# Patient Record
Sex: Female | Born: 1990 | Race: Black or African American | Hispanic: No | Marital: Single | Smoking: Current some day smoker
Health system: Southern US, Community
[De-identification: ages and names within clinical notes are randomized; demographics above are authoritative.]

## PROBLEM LIST (undated history)

## (undated) DIAGNOSIS — R011 Cardiac murmur, unspecified: Secondary | ICD-10-CM

---

## 2017-12-17 ENCOUNTER — Emergency Department: Payer: Self-pay

## 2017-12-17 ENCOUNTER — Other Ambulatory Visit: Payer: Self-pay

## 2017-12-17 ENCOUNTER — Encounter: Payer: Self-pay | Admitting: Emergency Medicine

## 2017-12-17 ENCOUNTER — Emergency Department
Admission: EM | Admit: 2017-12-17 | Discharge: 2017-12-17 | Disposition: A | Discharge status: Home / Self Care | Payer: Self-pay | Attending: Emergency Medicine | Admitting: Emergency Medicine

## 2017-12-17 DIAGNOSIS — K920 Hematemesis: Secondary | ICD-10-CM

## 2017-12-17 DIAGNOSIS — R112 Nausea with vomiting, unspecified: Secondary | ICD-10-CM

## 2017-12-17 DIAGNOSIS — I10 Essential (primary) hypertension: Secondary | ICD-10-CM

## 2017-12-17 DIAGNOSIS — R079 Chest pain, unspecified: Secondary | ICD-10-CM

## 2017-12-17 HISTORY — DX: Cardiac murmur, unspecified: R01.1

## 2017-12-17 LAB — BASIC METABOLIC PANEL
Anion gap: 8 (ref 5–15)
BUN: 14 mg/dL (ref 6–20)
CO2: 21 mmol/L — ABNORMAL LOW (ref 22–32)
CREATININE: 0.99 mg/dL (ref 0.44–1.00)
Calcium: 8.9 mg/dL (ref 8.9–10.3)
Chloride: 109 mmol/L (ref 98–111)
GFR calc Af Amer: >60 mL/min (ref >60)
Glucose, Bld: 101 mg/dL — ABNORMAL HIGH (ref 70–99)
Potassium: 3.3 mmol/L — ABNORMAL LOW (ref 3.5–5.1)
Sodium: 138 mmol/L (ref 135–145)

## 2017-12-17 LAB — CBC
HCT: 35.3 % (ref 35.0–47.0)
Hemoglobin: 11.9 g/dL — ABNORMAL LOW (ref 12.0–16.0)
MCH: 27.7 pg (ref 26.0–34.0)
MCHC: 33.8 g/dL (ref 32.0–36.0)
MCV: 82.1 fL (ref 80.0–100.0)
PLATELETS: 229 10*3/uL (ref 150–440)
RBC: 4.3 MIL/uL (ref 3.80–5.20)
RDW: 15.6 % — AB (ref 11.5–14.5)
WBC: 5.5 10*3/uL (ref 3.6–11.0)

## 2017-12-17 LAB — TROPONIN I: Troponin I: <0.03 ng/mL (ref <0.03)

## 2017-12-17 MED ORDER — TRAMADOL HCL 50 MG PO TABS
50.0000 mg | ORAL_TABLET | Freq: Once | ORAL | Status: AC
  Administered 2017-12-17: 50 mg via ORAL
  Filled 2017-12-17: qty 1

## 2017-12-17 MED ORDER — ONDANSETRON HCL 4 MG PO TABS
4.0000 mg | ORAL_TABLET | Freq: Once | ORAL | Status: AC
  Administered 2017-12-17: 4 mg via ORAL
  Filled 2017-12-17: qty 1

## 2017-12-17 MED ORDER — TRAMADOL HCL 50 MG PO TABS: 50.0000 mg | ORAL_TABLET | Freq: Four times a day (QID) | ORAL | 0 refills | Status: AC | PRN

## 2017-12-17 MED ORDER — HYDROCHLOROTHIAZIDE 12.5 MG PO CAPS: 12.5000 mg | ORAL_CAPSULE | Freq: Every day | ORAL | 0 refills | Status: DC

## 2017-12-17 MED ORDER — FAMOTIDINE 40 MG PO TABS: 40.0000 mg | ORAL_TABLET | Freq: Every evening | ORAL | 0 refills | Status: DC

## 2017-12-17 NOTE — ED Notes (Signed)
ED Provider at bedside. 

## 2017-12-17 NOTE — ED Triage Notes (Addendum)
Pt arrives via POV with complaints of right-sided chest pain last Wednesday or Thursday. Reports coughing for "a couple weeks" but this morning coughed up bright red blood x1. Also c/o vomiting. States she has vomited "over 5 times" in the last 24 hours.   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

## 2017-12-17 NOTE — ED Provider Notes (Signed)
[facility] Emergency Department Provider Note ____________________________________________   First MD Initiated Contact with Patient 12/17/17 [phone]     (approximate)  I have reviewed the triage vital signs and the nursing notes.   HISTORY  Chief Complaint Chest Pain  HPI Felicia Watkins is a 27 y.o. female with a history of chronic chest pain as well as chronic vomiting who is presenting to the emergency department with vomiting blood this morning as well as right-sided chest pain.  She says that she has vomited over 5 times in last 24 hours which she states is her baseline.  She also states that she has had intermittent chest pain over the past week which is now become constant and is a 9 out of 10, cramping and sharp pain.  It does not radiate.  She denies any shortness of breath.  No history of blood clots.  Denies being on any birth control.  Denies coughing up any blood.  She said that she became concerned this morning when she vomited and there were streaks of blood in the vomit she says which she has not had before.  She says that she has been evaluated [place] times in [place], [facility].  She says that she is also been told that she has high blood pressure multiple times in the past but has never been put on any medications for this.  Says that she smokes marijuana but does not drink or use any other drugs.  Denies smoking cigarettes.  Says that she had an aunt that died a young age from heart failure but denies any first-degree relatives dying of heart attacks.  Says that there are no alleviating or worsening factors for the chest pain including exertion or deep breathing.  Says the tramadol is helped to relieve the pain in the past.  Past Medical History:  Diagnosis Date  . Murmur     There are no active problems to display for this patient.   History reviewed. No pertinent surgical history.  Prior to Admission medications    Not on File    Allergies Patient has no known allergies.  No family history on file.  Social History Social History   Tobacco Use  . Smoking status: Not on file  Substance Use Topics  . Alcohol use: Not on file  . Drug use: Not on file    Review of Systems  Constitutional: No fever/chills Eyes: No visual changes. ENT: No sore throat. Cardiovascular: As above Respiratory: Denies shortness of breath. Gastrointestinal: No abdominal pain.    No diarrhea.  No constipation. Genitourinary: Negative for dysuria. Musculoskeletal: Negative for back pain. Skin: Negative for rash. Neurological: Negative for headaches, focal weakness or numbness.   ____________________________________________   PHYSICAL EXAM:  VITAL SIGNS: ED Triage Vitals  Enc Vitals Group     BP 12/17/17 0844 (!) 183/116     Pulse Rate 12/17/17 0842 87     Resp 12/17/17 0842 18     Temp 12/17/17 0842 98.7 F (37.1 C)     Temp src --      SpO2 12/17/17 0842 100 %     Weight 12/17/17 0844 200 lb (90.7 kg)     Height 12/17/17 0844 5\' 2"  (1.575 m)     Head Circumference --      Peak Flow --      Pain Score 12/17/17 0852 9     Pain Loc --      Pain  Edu? --      Excl. in GC? --     Constitutional: Alert and oriented. Well appearing and in no acute distress. Eyes: Conjunctivae are normal.  Head: Atraumatic. Nose: No congestion/rhinnorhea. Mouth/Throat: Mucous membranes are moist.  Neck: No stridor.   Cardiovascular: Normal rate, regular rhythm. Grossly normal heart sounds.  Good peripheral circulation. Respiratory: Normal respiratory effort.  No retractions. Lungs CTAB. Gastrointestinal: Soft and nontender. No distention.  Musculoskeletal: No lower extremity tenderness nor edema.  No joint effusions. Neurologic:  Normal speech and language. No gross focal neurologic deficits are appreciated. Skin:  Skin is warm, dry and intact. No rash noted. Psychiatric: Mood and affect are normal. Speech and  behavior are normal.  ____________________________________________   LABS (all labs ordered are listed, but only abnormal results are displayed)  Labs Reviewed  BASIC METABOLIC PANEL - Abnormal; Notable for the following components:      Result Value   Potassium 3.3 (*)    CO2 21 (*)    Glucose, Bld 101 (*)    All other components within normal limits  CBC - Abnormal; Notable for the following components:   Hemoglobin 11.9 (*)    RDW 15.6 (*)    All other components within normal limits  TROPONIN I  POC URINE PREG, ED   ____________________________________________  EKG  ED ECG REPORT I, Arelia Longest, the attending physician, personally viewed and interpreted this ECG.   Date: 12/17/2017  EKG Time: 0851  Rate: 74  Rhythm: normal sinus rhythm  Axis: Normal  Intervals:none  ST&T Change: No ST segment elevation or depression.  No abnormal T wave inversion.  ____________________________________________  RADIOLOGY  No acute finding on the chest x-ray ____________________________________________   PROCEDURES  Procedure(s) performed:   Procedures  Critical Care performed:   ____________________________________________   INITIAL IMPRESSION / ASSESSMENT AND PLAN / ED COURSE  Pertinent labs & imaging results that were available during my care of the patient were reviewed by me and considered in my medical decision making (see chart for details).  Differential diagnosis includes, but is not limited to, ACS, aortic dissection, pulmonary embolism, cardiac tamponade, pneumothorax, pneumonia, pericarditis, myocarditis, GI-related causes including esophagitis/gastritis, and musculoskeletal chest wall pain.   As part of my medical decision making, I reviewed the following data within the electronic MEDICAL RECORD NUMBERNo previous records able to be found even though the patient states that she has previously been evaluated at [facility]  ----------------------------------------- 11:29 AM on 12/17/2017 -----------------------------------------  Patient's chest pain is relieved after tramadol.  Very reassuring lab work-up.  Low risk for ACS.  PE RC negative.  Patient has not vomited in the emergency department and is tolerating p.o. fluids at this time.  I will give her cardiology as well as primary care follow-up.  Will be discharged with tramadol, HCTZ as well as Pepcid.  Likely blood in the vomitus secondary to Mallory-Weiss from repetitive vomiting.  Patient appears to be improved at this time. ____________________________________________   FINAL CLINICAL IMPRESSION(S) / ED DIAGNOSES  Chest pain.  Hypertension.  Nausea and vomiting.  NEW MEDICATIONS STARTED DURING THIS VISIT:  New Prescriptions   No medications on file     Note:  This document was prepared using Dragon voice recognition software and may include unintentional dictation errors.     Myrna Blazer, MD 12/17/17 1130

## 2017-12-17 NOTE — ED Notes (Signed)
Pt presents with chest pain x 3-4 days, with n/v/sob intermittently. She vomited "large amount" of blood this morning. Hx of heart murmur.

## 2017-12-18 ENCOUNTER — Telehealth: Payer: Self-pay

## 2017-12-18 NOTE — Telephone Encounter (Signed)
Lmov for patient/she was seen om ER for CP on 12/17/17  Will try again at a later time

## 2017-12-20 NOTE — Telephone Encounter (Signed)
Scheduled 9/24 Arida

## 2018-02-11 ENCOUNTER — Ambulatory Visit: Payer: Self-pay | Admitting: Cardiovascular Disease

## 2018-02-11 ENCOUNTER — Encounter

## 2018-02-11 NOTE — Progress Notes (Deleted)
    Cardiology Office Note   Date:  02/11/2018   ID:  Felicia Watkins, DOB Nov 08, 1990, MRN [id number]  PCP:  Patient, No Pcp Per  Cardiologist:  Lorine Bears*** Taiwo Fish, MD   No chief complaint on file.     History of Present Illness: Felicia Watkins is a 27 y.o. female who presents for ***    Past Medical History:  Diagnosis Date  . Murmur     No past surgical history on file.   Current Outpatient Medications  Medication Sig Dispense Refill  . famotidine (PEPCID) 40 MG tablet Take 1 tablet (40 mg total) by mouth every evening. 30 tablet 0  . hydrochlorothiazide (MICROZIDE) 12.5 MG capsule Take 1 capsule (12.5 mg total) by mouth daily. 15 capsule 0  . traMADol (ULTRAM) 50 MG tablet Take 1 tablet (50 mg total) by mouth every 6 (six) hours as needed. 10 tablet 0   No current facility-administered medications for this visit.     Allergies:   Patient has no known allergies.    Social History:  The patient     Family History:  The patient's ***family history is not on file.    ROS:  Please see the history of present illness.   Otherwise, review of systems are positive for {NONE DEFAULTED:18576::"none"}.   All other systems are reviewed and negative.    PHYSICAL EXAM: VS:  There were no vitals taken for this visit. , BMI There is no height or weight on file to calculate BMI. GEN: Well nourished, well developed, in no acute distress  HEENT: normal  Neck: no JVD, carotid bruits, or masses Cardiac: ***RRR; no murmurs, rubs, or gallops,no edema  Respiratory:  clear to auscultation bilaterally, normal work of breathing GI: soft, nontender, nondistended, + BS MS: no deformity or atrophy  Skin: warm and dry, no rash Neuro:  Strength and sensation are intact Psych: euthymic mood, full affect   EKG:  EKG {ACTION; IS/IS [id number]}OT:[id number]} ordered today. The ekg ordered today demonstrates ***   Recent Labs: 12/17/2017: BUN 14; Creatinine, Ser 0.99; Hemoglobin 11.9; Platelets 229;  Potassium 3.3; Sodium 138    Lipid Panel No results found for: CHOL, TRIG, HDL, CHOLHDL, VLDL, LDLCALC, LDLDIRECT    Wt Readings from Last 3 Encounters:  12/17/17 200 lb (90.7 kg)      Other studies Reviewed: Additional studies/ records that were reviewed today include: ***. Review of the above records demonstrates: ***  No flowsheet data found.    ASSESSMENT AND PLAN:  1.  ***    Disposition:   FU with *** in {gen number [id number]}0-10:[id number]} {Days to years:10300}  Signed,  Lorine BearsMuhammad Ridhaan Dreibelbis, MD  02/11/2018 1:56 PM    [facility]

## 2018-02-12 ENCOUNTER — Encounter: Payer: Self-pay | Admitting: Cardiovascular Disease

## 2018-12-09 ENCOUNTER — Encounter: Payer: Self-pay | Admitting: Emergency Medicine

## 2018-12-09 ENCOUNTER — Emergency Department
Admission: EM | Admit: 2018-12-09 | Discharge: 2018-12-09 | Disposition: A | Discharge status: Home / Self Care | Payer: Self-pay | Attending: Emergency Medicine | Admitting: Emergency Medicine

## 2018-12-09 ENCOUNTER — Other Ambulatory Visit: Payer: Self-pay

## 2018-12-09 DIAGNOSIS — Z720 Tobacco use: Secondary | ICD-10-CM | POA: Insufficient documentation

## 2018-12-09 DIAGNOSIS — R1110 Vomiting, unspecified: Secondary | ICD-10-CM | POA: Insufficient documentation

## 2018-12-09 NOTE — ED Provider Notes (Signed)
[facility] Emergency Department Provider Note  ____________________________________________  Time seen: Approximately 5:24 PM  I have reviewed the triage vital signs and the nursing notes.   HISTORY  Chief Complaint Medical Clearance    HPI Felicia Watkins is a 28 y.o. female that presents to the emergency department for a return to work note.  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  It was about 100 F today outside and hotter in the kitchen.  Patient states that she got hot and threw up once.  Her manager told her to go home and come back to work with a new work note.  Patient states that she has felt fine since her one episode of emesis.  She is drinking plenty of water.  She is hungry and is planning to get takeout food when she leaves the hospital.  No URI symptoms.  No sick contacts.  Patient feels well.  No fever, shortness of breath, cough, nasal congestion, diarrhea.  Past Medical History:  Diagnosis Date  . Murmur     There are no active problems to display for this patient.   History reviewed. No pertinent surgical history.  Prior to Admission medications   Medication Sig Start Date End Date Taking? Authorizing Provider  famotidine (PEPCID) 40 MG tablet Take 1 tablet (40 mg total) by mouth every evening. 12/17/17 12/17/18  Schaevitz, Randall An, MD  hydrochlorothiazide (MICROZIDE) 12.5 MG capsule Take 1 capsule (12.5 mg total) by mouth daily. 12/17/17   Schaevitz, Randall An, MD  traMADol (ULTRAM) 50 MG tablet Take 1 tablet (50 mg total) by mouth every 6 (six) hours as needed. 12/17/17 12/17/18  Schaevitz, Randall An, MD    Allergies Patient has no known allergies.  History reviewed. No pertinent family history.  Social History Social History   Tobacco Use  . Smoking status: Current Some Day Smoker  . Smokeless tobacco: Never Used  Substance Use Topics  . Alcohol use: Not  on file  . Drug use: Not on file     Review of Systems  Constitutional: No fever/chills Cardiovascular: No chest pain. Respiratory: No cough. No SOB. Gastrointestinal: No nausea. One episode of vomiting. No abdominal pain Musculoskeletal: Negative for musculoskeletal pain. Skin: Negative for rash, abrasions, lacerations, ecchymosis. Neurological: Negative for headaches.   ____________________________________________   PHYSICAL EXAM:  VITAL SIGNS: ED Triage Vitals  Enc Vitals Group     BP 12/09/18 1700 (!) 157/95     Pulse Rate 12/09/18 1700 74     Resp 12/09/18 1700 16     Temp 12/09/18 1700 98.1 F (36.7 C)     Temp Source 12/09/18 1700 Oral     SpO2 12/09/18 1700 100 %     Weight 12/09/18 1703 165 lb (74.8 kg)     Height 12/09/18 1703 5\' 2"  (1.575 m)     Head Circumference --      Peak Flow --      Pain Score 12/09/18 1702 0     Pain Loc --      Pain Edu? --      Excl. in [place]? --      Constitutional: Alert and oriented. Well appearing and in no acute distress. Eyes: Conjunctivae are normal. PERRL. EOMI. Head: Atraumatic. ENT:      Ears:      Nose: No congestion/rhinnorhea.      Mouth/Throat: Mucous membranes are moist.  Neck: No stridor.  Cardiovascular: Normal rate, regular rhythm.  Good peripheral circulation. Respiratory: Normal respiratory effort without tachypnea or retractions. Lungs CTAB. Good air entry to the bases with no decreased or absent breath sounds. Gastrointestinal: Bowel sounds 4 quadrants. Soft and nontender to palpation. No guarding or rigidity. No palpable masses. No distention.  Musculoskeletal: Full range of motion to all extremities. No gross deformities appreciated. Neurologic:  Normal speech and language. No gross focal neurologic deficits are appreciated.  Skin:  Skin is warm, dry and intact. No rash noted. Psychiatric: Mood and affect are normal. Speech and behavior are normal. Patient exhibits appropriate insight and  judgement.   ____________________________________________   LABS (all labs ordered are listed, but only abnormal results are displayed)  Labs Reviewed - No data to display ____________________________________________  EKG   ____________________________________________  RADIOLOGY   No results found.  ____________________________________________    PROCEDURES  Procedure(s) performed:    Procedures    Medications - No data to display   ____________________________________________   INITIAL IMPRESSION / ASSESSMENT AND PLAN / ED COURSE  Pertinent labs & imaging results that were available during my care of the patient were reviewed by me and considered in my medical decision making (see chart for details).  Review of the [place] CSRS was performed in accordance of the [facility] prior to dispensing any controlled drugs.   Patient presents emergency department requesting a return to work note.  Patient vomited once in the heat while at work in the kitchen.  Patient appears well and is eating graham crackers and peanut butter in the emergency department without difficulty.  No URI symptoms or contacts with COVID-19.  Work note was provided.  Patient is to follow up with primary care as directed. Patient is given ED precautions to return to the ED for any worsening or new symptoms.  Felicia Watkins was evaluated in Emergency Department on 12/09/2018 for the symptoms described in the history of present illness. She was evaluated in the context of the global COVID-19 pandemic, which necessitated consideration that the patient might be at risk for infection with the SARS-CoV-2 virus that causes COVID-19. Institutional protocols and algorithms that pertain to the evaluation of patients at risk for COVID-19 are in a state of rapid change based on information released by regulatory bodies including the CDC and federal and state organizations. These policies and algorithms were followed during  the patient's care in the ED.   ____________________________________________  FINAL CLINICAL IMPRESSION(S) / ED DIAGNOSES  Final diagnoses:  Vomiting, intractability of vomiting not specified, presence of nausea not specified, unspecified vomiting type      NEW MEDICATIONS STARTED DURING THIS VISIT:  ED Discharge Orders    None          This chart was dictated using voice recognition software/Dragon. Despite best efforts to proofread, errors can occur which can change the meaning. Any change was purely unintentional.    Enid DerryWagner, Ariadna Setter, PA-C 12/09/18 2305    Dionne BucySiadecki, Sebastian, MD 12/09/18 [phone]

## 2018-12-09 NOTE — Discharge Instructions (Signed)
If symptoms return, please do not return to work.  Please follow-up with primary care.

## 2018-12-09 NOTE — ED Triage Notes (Signed)
Pt presents via POV. Pt needs doctors note for clearance to return back to work. Pt works at [employer] and states there was no air conditioning and she got overheated and threw up. Pt manager at work requesting pt get work note before she returns to work.

## 2019-09-20 ENCOUNTER — Emergency Department
Admission: EM | Admit: 2019-09-20 | Discharge: 2019-09-20 | Disposition: A | Discharge status: Home / Self Care | Payer: Self-pay | Attending: Student | Admitting: Student

## 2019-09-20 ENCOUNTER — Other Ambulatory Visit: Payer: Self-pay

## 2019-09-20 ENCOUNTER — Encounter: Payer: Self-pay | Admitting: Emergency Medicine

## 2019-09-20 DIAGNOSIS — Z79899 Other long term (current) drug therapy: Secondary | ICD-10-CM | POA: Insufficient documentation

## 2019-09-20 DIAGNOSIS — R112 Nausea with vomiting, unspecified: Secondary | ICD-10-CM | POA: Insufficient documentation

## 2019-09-20 DIAGNOSIS — R030 Elevated blood-pressure reading, without diagnosis of hypertension: Secondary | ICD-10-CM | POA: Insufficient documentation

## 2019-09-20 DIAGNOSIS — F17200 Nicotine dependence, unspecified, uncomplicated: Secondary | ICD-10-CM | POA: Insufficient documentation

## 2019-09-20 MED ORDER — HYDROCHLOROTHIAZIDE 12.5 MG PO CAPS: 12.5000 mg | ORAL_CAPSULE | Freq: Every day | ORAL | 3 refills | Status: DC

## 2019-09-20 NOTE — ED Triage Notes (Signed)
Pt started working at [employer] and they were trying to get air working and was hot inside and pt vomit X 1. They made her leave. Does not want work up just wants a work note.

## 2019-09-20 NOTE — Discharge Instructions (Signed)
Your exam and vital signs are normal at this time. You BP is elevated, and we will restart a BP medicine for you. Continue to rest and hydrate to prevent nausea, vomiting, or passing out. Follow-up with [facility] for routine healthcare.

## 2019-09-20 NOTE — ED Provider Notes (Signed)
[facility] Emergency Department Provider Note ____________________________________________  Time seen: 1357  I have reviewed the triage vital signs and the nursing notes.  HISTORY  Chief Complaint  work note  HPI Felicia Watkins is a 29 y.o. female presents herself to the ED at the advice of her employer.   Patient works at a newly opened [employer], and reports that the central air in [employer] was not working.  She reports getting overheated and had a single episode of nonbloody, nonbilious emesis.  The advised her to leave work, and report to the ED for evaluation prior to being cleared for work.  Patient denies any ongoing symptoms at this time.  She reports she feels healthy and well.  She has not had any continued nausea or vomiting since a single episode.  She also denies any cough, congestion, fever, chills, sweats.  She also denies any daily medications and reports a history of hypertension.  She admits that she has not taken blood pressure medicine since her initial prescription was provided here a year prior.  Past Medical History:  Diagnosis Date  . Murmur     There are no problems to display for this patient.   History reviewed. No pertinent surgical history.  Prior to Admission medications   Medication Sig Start Date End Date Taking? Authorizing Provider  hydrochlorothiazide (MICROZIDE) 12.5 MG capsule Take 1 capsule (12.5 mg total) by mouth daily. 09/20/19 01/18/20  Reznor Ferrando, Charlesetta Ivory, PA-C    Allergies Patient has no known allergies.  History reviewed. No pertinent family history.  Social History Social History   Tobacco Use  . Smoking status: Current Some Day Smoker  . Smokeless tobacco: Never Used  Substance Use Topics  . Alcohol use: Not on file  . Drug use: Not on file    Review of Systems  Constitutional: Negative for fever. Eyes: Negative for visual changes. ENT: Negative for sore throat. Cardiovascular:  Negative for chest pain. Respiratory: Negative for shortness of breath. Gastrointestinal: Negative for abdominal pain and diarrhea.  Single episode of vomiting as above. Genitourinary: Negative for dysuria. Musculoskeletal: Negative for back pain. Skin: Negative for rash. Neurological: Negative for headaches, focal weakness or numbness. ____________________________________________  PHYSICAL EXAM:  VITAL SIGNS: ED Triage Vitals  Enc Vitals Group     BP 09/20/19 1333 (!) 152/93     Pulse Rate 09/20/19 1333 99     Resp 09/20/19 1333 16     Temp 09/20/19 1333 98.1 F (36.7 C)     Temp Source 09/20/19 1333 Oral     SpO2 09/20/19 1333 100 %     Weight 09/20/19 1330 170 lb (77.1 kg)     Height 09/20/19 1330 5\' 2"  (1.575 m)     Head Circumference --      Peak Flow --      Pain Score 09/20/19 1330 0     Pain Loc --      Pain Edu? --      Excl. in GC? --     Constitutional: Alert and oriented. Well appearing and in no distress. Head: Normocephalic and atraumatic. Eyes: Conjunctivae are normal. Normal extraocular movements Neck: Supple. No thyromegaly. Hematological/Lymphatic/Immunological: No cervical lymphadenopathy. Cardiovascular: Normal rate, regular rhythm. Normal distal pulses. Respiratory: Normal respiratory effort. No wheezes/rales/rhonchi. Gastrointestinal: Soft and nontender. No distention. Musculoskeletal: Nontender with normal range of motion in all extremities.  Neurologic:  Normal gait without ataxia. Normal speech and language. No gross focal neurologic deficits are appreciated.  Skin:  Skin is warm, dry and intact. No rash noted. Psychiatric: Mood and affect are normal. Patient exhibits appropriate insight and judgment. ____________________________________________  PROCEDURES  Procedures ____________________________________________  INITIAL IMPRESSION / ASSESSMENT AND PLAN / ED COURSE  Patient with ED evaluation of a single episode of nausea and vomiting.   Patient has declined any further testing at this time.  I advised her that we would not be able to clearly determine if there is a cause for her single episode of nausea and vomiting, but she clearly seems to be stable at this time.  She will be discharged charged with a prescription for HCTZ for control of her blood pressure.  She is advised to continue to monitor symptoms and hydrate to prevent dehydration.  She is released [facility].  A work note is provided return her to work tomorrow as planned.  Felicia Watkins was evaluated in Emergency Department on 09/20/2019 for the symptoms described in the history of present illness. She was evaluated in the context of the global COVID-19 pandemic, which necessitated consideration that the patient might be at risk for infection with the SARS-CoV-2 virus that causes COVID-19. Institutional protocols and algorithms that pertain to the evaluation of patients at risk for COVID-19 are in a state of rapid change based on information released by regulatory bodies including the CDC and federal and state organizations. These policies and algorithms were followed during the patient's care in the ED. ____________________________________________  FINAL CLINICAL IMPRESSION(S) / ED DIAGNOSES  Final diagnoses:  Nausea and vomiting, intractability of vomiting not specified, unspecified vomiting type  Elevated BP without diagnosis of hypertension      Hildagarde Holleran, Dannielle Karvonen, PA-C 09/20/19 2055    Lilia Pro., MD 09/21/19 [phone]

## 2019-09-20 NOTE — ED Notes (Signed)
See triage note  Stats she needs a note to go back to work    States she became "hot" then dizzy  Vomited times 1   Feels much better now

## 2019-12-08 ENCOUNTER — Telehealth: Payer: Self-pay | Admitting: General Practice

## 2019-12-08 NOTE — Telephone Encounter (Signed)
Individual has been contacted 3+ times regarding ED referral. No further attempts to contact individual will be made. 

## 2020-02-11 IMAGING — CR DG CHEST 2V
1 series · 2 of 2 positions shown · non-contrast
Comparison: None.

CLINICAL DATA: Chest pain

EXAM:
CHEST - 2 VIEW

[Series 1: dg chest 2 view · 0.14mm/px · 2 of 2 slices shown]
[im 1/2]
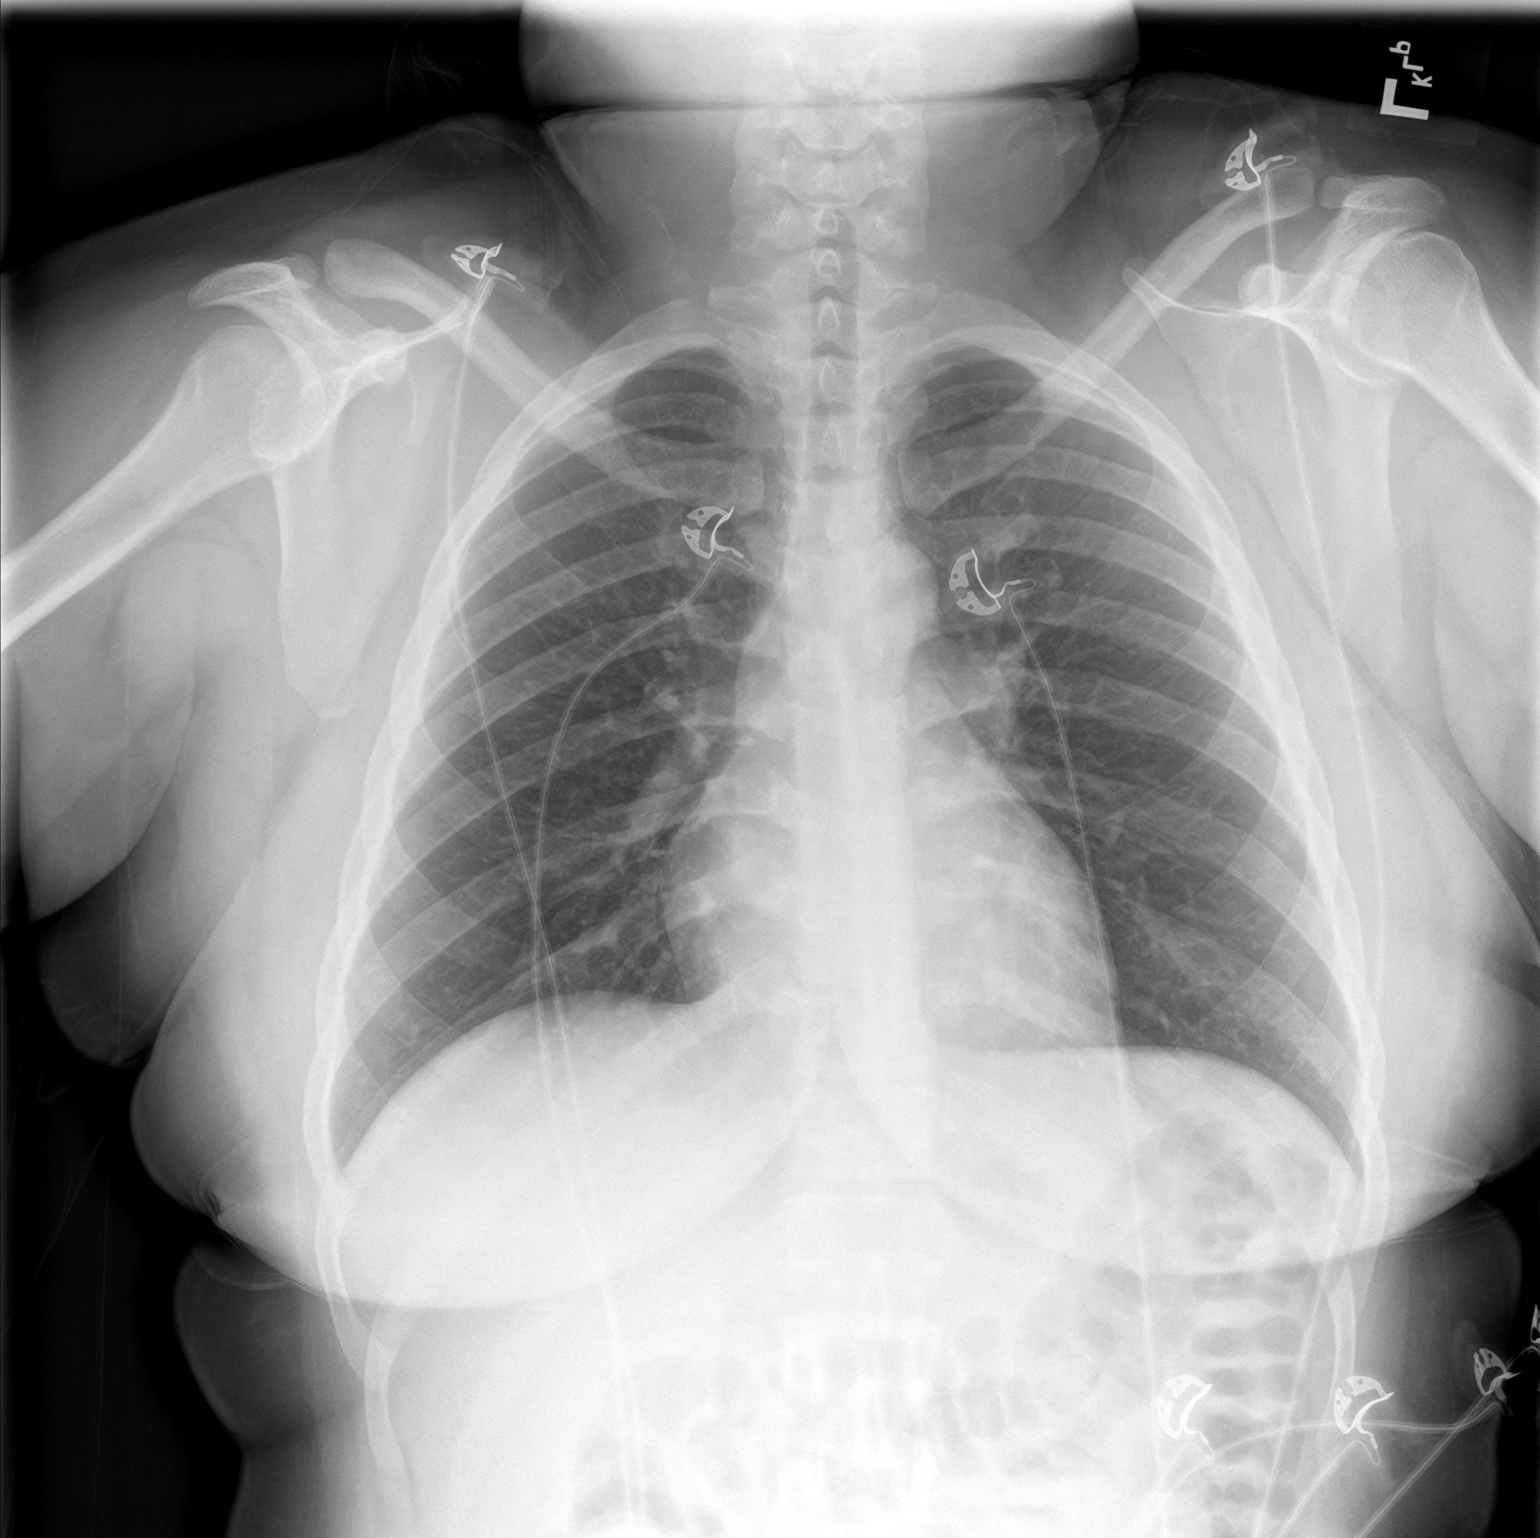
[im 2/2]
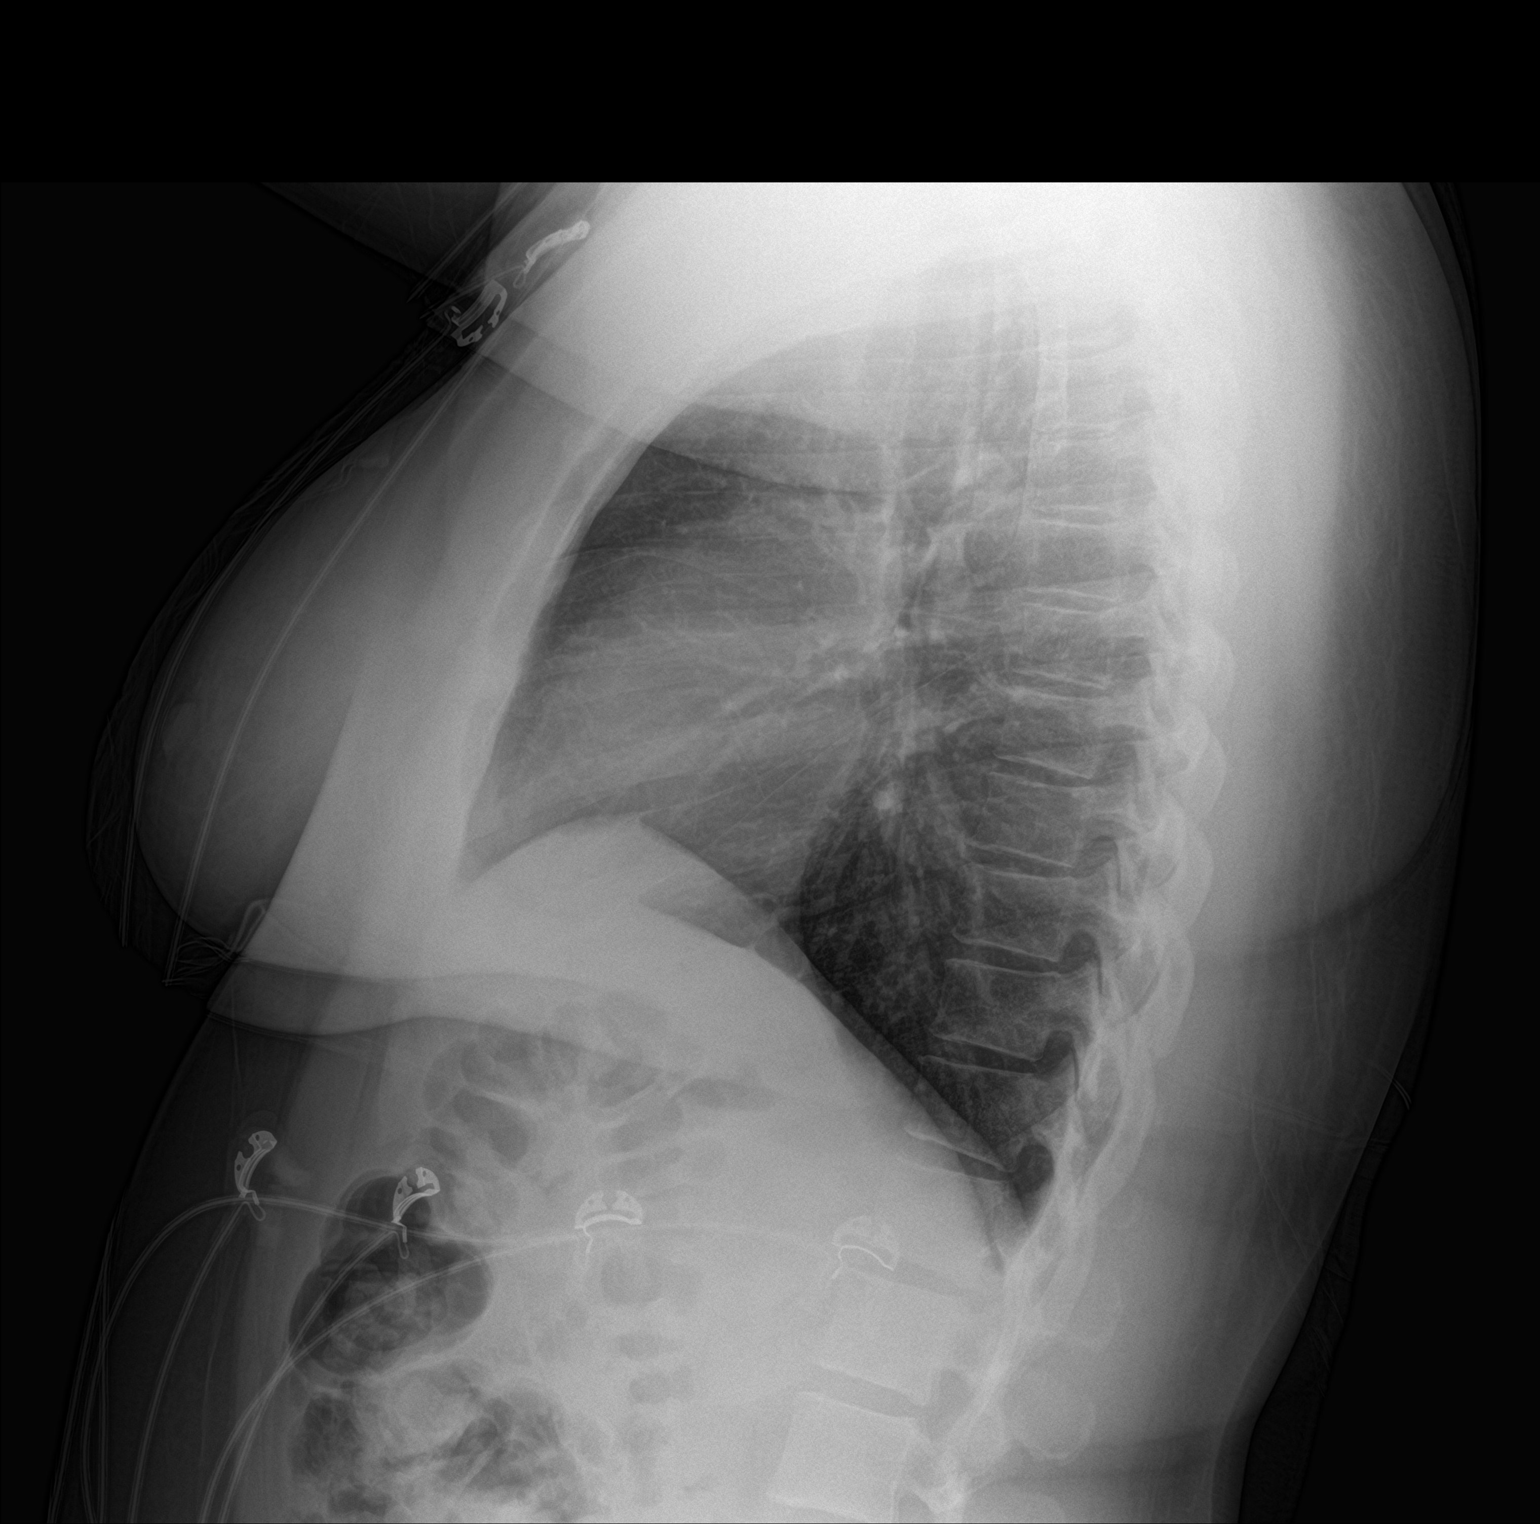

[2 of 2 positions shown; findings below may reference images not displayed]

FINDINGS: Heart and mediastinal contours are within normal limits. No focal
opacities or effusions. No acute bony abnormality.
IMPRESSION: No active cardiopulmonary disease.

## 2020-11-25 ENCOUNTER — Other Ambulatory Visit: Payer: Self-pay

## 2020-11-25 ENCOUNTER — Emergency Department
Admission: EM | Admit: 2020-11-25 | Discharge: 2020-11-25 | Disposition: A | Discharge status: Home / Self Care | Payer: Self-pay | Attending: Emergency Medicine | Admitting: Emergency Medicine

## 2020-11-25 DIAGNOSIS — F17200 Nicotine dependence, unspecified, uncomplicated: Secondary | ICD-10-CM | POA: Insufficient documentation

## 2020-11-25 DIAGNOSIS — I10 Essential (primary) hypertension: Secondary | ICD-10-CM | POA: Insufficient documentation

## 2020-11-25 DIAGNOSIS — R42 Dizziness and giddiness: Secondary | ICD-10-CM

## 2020-11-25 MED ORDER — HYDROCHLOROTHIAZIDE 12.5 MG PO CAPS: 12.5000 mg | ORAL_CAPSULE | Freq: Every day | ORAL | 1 refills | Status: DC

## 2020-11-25 NOTE — ED Triage Notes (Signed)
First Nurse Note:  Arrives c/o having a dizzy spell at work.  States "I think I was just hot.  I work in a [employer].  I really just need a doctors note".  AAOx3.  Skin warm and dry. NAD

## 2020-11-25 NOTE — ED Provider Notes (Signed)
[facility] Emergency Department Provider Note  ____________________________________________  Time seen: Approximately 7:08 PM  I have reviewed the triage vital signs and the nursing notes.   HISTORY  Chief Complaint evaluation   HPI Felicia Watkins is a 30 y.o. female who presents to the emergency department after getting dizzy at work. She states that the air conditioning has been out and she just got too hot, felt dizzy, and vomited. She feels fine now and just needs a work excuse.  Past Medical History:  Diagnosis Date   Murmur     There are no problems to display for this patient.   No past surgical history on file.  Prior to Admission medications   Medication Sig Start Date End Date Taking? Authorizing Provider  hydrochlorothiazide (MICROZIDE) 12.5 MG capsule Take 1 capsule (12.5 mg total) by mouth daily. 11/25/20 03/25/21  Chinita Pester, FNP    Allergies Patient has no known allergies.  No family history on file.  Social History Social History   Tobacco Use   Smoking status: Some Days    Pack years: 0.00   Smokeless tobacco: Never    Review of Systems Constitutional: Negative for fever. ENT: Negative for sore throat. Respiratory: negative for cough Gastrointestinal: No abdominal pain.  No nausea, no vomiting.  No diarrhea.  Musculoskeletal: Negative for generalized body aches. Skin: Negative for rash/lesion/wound. Neurological: Negative for headaches, focal weakness or numbness.  ____________________________________________   PHYSICAL EXAM:  VITAL SIGNS: ED Triage Vitals  Enc Vitals Group     BP 11/25/20 1815 (!) 167/115     Pulse Rate 11/25/20 1815 88     Resp 11/25/20 1815 20     Temp 11/25/20 1815 98.5 F (36.9 C)     Temp Source 11/25/20 1815 Oral     SpO2 11/25/20 1815 100 %     Weight 11/25/20 1816 164 lb (74.4 kg)     Height 11/25/20 1816 5\' 2"  (1.575 m)     Head Circumference --      Peak Flow --      Pain  Score 11/25/20 1816 0     Pain Loc --      Pain Edu? --      Excl. in GC? --     Constitutional: Alert and oriented. Well appearing and in no acute distress. Eyes: Conjunctivae are normal. Head: Atraumatic. Nose: No congestion/rhinnorhea. Mouth/Throat: Mucous membranes are moist. Neck: No stridor.  Cardiovascular: Normal rate, regular rhythm. Good peripheral circulation. Respiratory: Normal respiratory effort. Musculoskeletal: Full ROM throughout.  Neurologic:  Normal speech and language. No gross focal neurologic deficits are appreciated. Speech is normal. No gait instability. Skin:  Skin is warm, dry and intact. No rash noted. Psychiatric: Mood and affect are normal. Speech and behavior are normal.  ____________________________________________   LABS (all labs ordered are listed, but only abnormal results are displayed)  Labs Reviewed - No data to display ____________________________________________  EKG  Not indicated. ____________________________________________  RADIOLOGY  Not indicated. ____________________________________________   PROCEDURES  None ____________________________________________   INITIAL IMPRESSION / ASSESSMENT AND PLAN / ED COURSE     Pertinent labs & imaging results that were available during my care of the patient were reviewed by me and considered in my medical decision making (see chart for details).  Patient noted to be hypertensive. She states that she has been on medicine in the past, but has been out for a "long time." She denies dizziness in the past and feels that  it is not related to blood pressure.   Patient was advised to take HCTZ as prescribed and advised the importance of establishing PCP and getting blood pressure under control. ____________________________________________   FINAL CLINICAL IMPRESSION(S) / ED DIAGNOSES  Final diagnoses:  Hypertension, unspecified type  Dizziness       Chinita Pester,  FNP 11/25/20 1913    Chesley Noon, MD 11/29/20 1737

## 2020-11-25 NOTE — ED Triage Notes (Signed)
Pt to ED for doctor's note. Says work told her she needed one d/t getting dizzy spell at work today, states a/c is out a has been working a lot of days. "Just got a dizzy episode". Denies any complaints at all.  Denies pain, dizziness, loc. Hx HTN Alert and oriented. Ambulatory to triage.  Pt states just wants doctors note  Denies EKG or blood work

## 2021-01-24 ENCOUNTER — Emergency Department
Admission: EM | Admit: 2021-01-24 | Discharge: 2021-01-24 | Disposition: A | Discharge status: Home / Self Care | Payer: Self-pay | Attending: Emergency Medicine | Admitting: Emergency Medicine

## 2021-01-24 DIAGNOSIS — R1110 Vomiting, unspecified: Secondary | ICD-10-CM | POA: Insufficient documentation

## 2021-01-24 DIAGNOSIS — Z5321 Procedure and treatment not carried out due to patient leaving prior to being seen by health care provider: Secondary | ICD-10-CM | POA: Insufficient documentation

## 2021-01-24 NOTE — ED Notes (Signed)
Called pt x3 to be roomed. No answer.

## 2021-01-24 NOTE — ED Triage Notes (Signed)
Pt presets to ED for work note per her work. Pt states she got really hot and had one instance of emesis. Pt states she works at [employer] and states "its really hot back there". Pt states she felt fine before this happened and feels fine now. Pt denies any complaints at this time.  NAD noted. VSS.

## 2022-03-20 ENCOUNTER — Emergency Department
Admission: EM | Admit: 2022-03-20 | Discharge: 2022-03-20 | Disposition: A | Discharge status: Home / Self Care | Payer: 59 | Attending: Emergency Medicine | Admitting: Emergency Medicine

## 2022-03-20 ENCOUNTER — Other Ambulatory Visit: Payer: Self-pay

## 2022-03-20 DIAGNOSIS — D649 Anemia, unspecified: Secondary | ICD-10-CM | POA: Insufficient documentation

## 2022-03-20 DIAGNOSIS — R42 Dizziness and giddiness: Secondary | ICD-10-CM

## 2022-03-20 DIAGNOSIS — R1110 Vomiting, unspecified: Secondary | ICD-10-CM | POA: Insufficient documentation

## 2022-03-20 DIAGNOSIS — R112 Nausea with vomiting, unspecified: Secondary | ICD-10-CM

## 2022-03-20 LAB — URINALYSIS, ROUTINE W REFLEX MICROSCOPIC
Bacteria, UA: NONE SEEN
Bilirubin Urine: NEGATIVE
Glucose, UA: NEGATIVE mg/dL
Ketones, ur: NEGATIVE mg/dL
Leukocytes,Ua: NEGATIVE
Nitrite: NEGATIVE
Protein, ur: NEGATIVE mg/dL
Specific Gravity, Urine: 1.017 (ref 1.005–1.030)
pH: 6 (ref 5.0–8.0)

## 2022-03-20 LAB — COMPREHENSIVE METABOLIC PANEL
ALT: 12 U/L (ref 0–44)
AST: 17 U/L (ref 15–41)
Albumin: 3.6 g/dL (ref 3.5–5.0)
Alkaline Phosphatase: 57 U/L (ref 38–126)
Anion gap: 5 (ref 5–15)
BUN: 11 mg/dL (ref 6–20)
CO2: 21 mmol/L — ABNORMAL LOW (ref 22–32)
Calcium: 8.8 mg/dL — ABNORMAL LOW (ref 8.9–10.3)
Chloride: 110 mmol/L (ref 98–111)
Creatinine, Ser: 0.85 mg/dL (ref 0.44–1.00)
GFR, Estimated: >60 mL/min (ref >60)
Glucose, Bld: 98 mg/dL (ref 70–99)
Potassium: 3.9 mmol/L (ref 3.5–5.1)
Sodium: 136 mmol/L (ref 135–145)
Total Bilirubin: 0.9 mg/dL (ref 0.3–1.2)
Total Protein: 7.5 g/dL (ref 6.5–8.1)

## 2022-03-20 LAB — CBC
HCT: 28.9 % — ABNORMAL LOW (ref 36.0–46.0)
Hemoglobin: 8.2 g/dL — ABNORMAL LOW (ref 12.0–15.0)
MCH: 19.2 pg — ABNORMAL LOW (ref 26.0–34.0)
MCHC: 28.4 g/dL — ABNORMAL LOW (ref 30.0–36.0)
MCV: 67.8 fL — ABNORMAL LOW (ref 80.0–100.0)
Platelets: 373 10*3/uL (ref 150–400)
RBC: 4.26 MIL/uL (ref 3.87–5.11)
RDW: 17.4 % — ABNORMAL HIGH (ref 11.5–15.5)
WBC: 5.7 10*3/uL (ref 4.0–10.5)
nRBC: 0 % (ref 0.0–0.2)

## 2022-03-20 LAB — LIPASE, BLOOD: Lipase: 39 U/L (ref 11–51)

## 2022-03-20 LAB — POC URINE PREG, ED: Preg Test, Ur: NEGATIVE

## 2022-03-20 MED ORDER — FERROUS SULFATE 325 (65 FE) MG PO TABS: 325.0000 mg | ORAL_TABLET | Freq: Every day | ORAL | 3 refills | Status: AC

## 2022-03-20 NOTE — Discharge Instructions (Addendum)
As we discussed please call the number provided for [facility] clinic to arrange a follow-up appointment soon as possible to discuss your anemia.  Please begin taking your iron supplements as prescribed.  Return to the emergency department for any chest pain, shortness of breath, or any other symptom personally concerning to yourself.

## 2022-03-20 NOTE — ED Triage Notes (Signed)
Pt to ED via POV from work. Pt reports she works two jobs and got dizzy this morning and vomited x1. Pt states symptoms have resolved at this time.   Pt BP in triage 178/102 but states she has been told she has high BP but is not on any medication currently.

## 2022-03-20 NOTE — ED Provider Notes (Signed)
[facility] Provider Note    Event Date/Time   First MD Initiated Contact with Patient 03/20/22 [phone]     (approximate)  History   Chief Complaint: Dizziness (Emesis x1)  HPI  Felicia Watkins is a 31 y.o. female with no significant past medical history presents to the emergency department after an episode of dizziness and vomiting.  According to the patient she states she works 2 jobs, she had to show up to her second job at 4 AM after working the evening at her other job.  States when she got to her job she was just feeling somewhat dizzy had 1 episode of vomiting.  She states after she vomited she feels much better and now denies any symptoms.  States she feels like she just needs some sleep.  Patient denies any recent illnesses such as cough congestion or fever.  Denies any chest pain shortness of breath or abdominal pain.  Physical Exam   Triage Vital Signs: ED Triage Vitals [03/20/22 0812]  Enc Vitals Group     BP (!) 178/102     Pulse Rate 92     Resp 18     Temp 98.1 F (36.7 C)     Temp Source Oral     SpO2 99 %     Weight      Height      Head Circumference      Peak Flow      Pain Score 0     Pain Loc      Pain Edu?      Excl. in GC?     Most recent vital signs: Vitals:   03/20/22 0812  BP: (!) 178/102  Pulse: 92  Resp: 18  Temp: 98.1 F (36.7 C)  SpO2: 99%    General: Awake, no distress.  CV:  Good peripheral perfusion.  Regular rate and rhythm  Resp:  Normal effort.  Equal breath sounds bilaterally.  Abd:  No distention.  Soft, nontender.  No rebound or guarding.   ED Results / Procedures / Treatments   MEDICATIONS ORDERED IN ED: Medications - No data to display   IMPRESSION / MDM / ASSESSMENT AND PLAN / ED COURSE  I reviewed the triage vital signs and the nursing notes.  Patient's presentation is most consistent with acute illness / injury with system symptoms.  Patient presents emergency department after an  episode of dizziness and vomiting at her job.  Patient states she did not get much sleep as she works 2 jobs she should have her second job at 4 AM and states she was not feeling very well she vomited x1 and is now feeling much better per patient.  Patient denies any symptoms currently.  Patient believes this occurred because she did not get much sleep before going to work.  Patient denies any abdominal pain.  We will check labs.  If the patient's labs are reassuring anticipate likely discharge home.  Patient agreeable to plan of care.  Patient's labs show reassuring chemistry normal lipase.  Patient CBC does show anemia hemoglobin 8.2.  Discussed this with the patient she denies any black or bloody stool.  States she will follow-up with her doctor regarding this.  Patient's urinalysis has not been sent yet but the patient states she is ready to go and she wants to go home and sleep.  Denies any urinary symptoms.  We will discharge with PCP follow-up.  Patient agreeable to plan.  We will prescribe  iron supplements until the patient can follow-up with her PCP given MCV of 67.  FINAL CLINICAL IMPRESSION(S) / ED DIAGNOSES   Emesis Anemia   Note:  This document was prepared using Dragon voice recognition software and may include unintentional dictation errors.   Harvest Dark, MD 03/20/22 [phone]

## 2022-04-06 ENCOUNTER — Other Ambulatory Visit: Payer: Self-pay

## 2022-04-06 ENCOUNTER — Encounter: Payer: Self-pay | Admitting: Emergency Medicine

## 2022-04-06 ENCOUNTER — Emergency Department
Admission: EM | Admit: 2022-04-06 | Discharge: 2022-04-06 | Disposition: A | Discharge status: Home / Self Care | Payer: 59 | Attending: Emergency Medicine | Admitting: Emergency Medicine

## 2022-04-06 DIAGNOSIS — R42 Dizziness and giddiness: Secondary | ICD-10-CM | POA: Insufficient documentation

## 2022-04-06 LAB — CBG MONITORING, ED: Glucose-Capillary: 115 mg/dL — ABNORMAL HIGH (ref 70–99)

## 2022-04-06 NOTE — ED Triage Notes (Signed)
Pt comes with c/o getting a little dizzy while at work. Pt state she was at work and it got overheated in the kitchen and smoky. Pt state she got a little dizzy feeling. Pt denies any blurry vision or pain.  Pt states she is just tired. Pt thinks she may have just gotten overwhelmed.

## 2022-04-06 NOTE — Discharge Instructions (Signed)
Stay Well-hydrated by drinking fluids.  Thank you for choosing [place] for your health care today!  Please see your primary doctor this week for a follow up appointment.   If you do not have a primary doctor call the following clinics to establish care:  If you have insurance:  [facility] [phone] [address] [place] [place] [address]   [facility]  [phone] [address] [place] [place] [address]   If you do not have insurance:  [facility]  [phone] [address] [place] [address]  Sometimes, in the early stages of certain disease courses it is difficult to detect in the emergency department evaluation -- so, it is important that you continue to monitor your symptoms and call your doctor right away or return to the emergency department if you develop any new or worsening symptoms.  It was my pleasure to care for you today.   Daneil Dan Modesto Charon, MD

## 2022-04-06 NOTE — ED Provider Notes (Signed)
[facility] Provider Note    Event Date/Time   First MD Initiated Contact with Patient 04/06/22 1109     (approximate)   History   Dizziness   HPI  Felicia Watkins is a 31 y.o. 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  The fans are broken over the cooking area and kitchen causing the kitchen to be very hot.  She had no chest pain shortness of breath palpitations and did not pass out.  After removing herself from the heat she felt a lot better.  She has no other complaints and feels well now.  History was obtained via the patient      Physical Exam   Triage Vital Signs: ED Triage Vitals  Enc Vitals Group     BP 04/06/22 1055 (!) 169/108     Pulse Rate 04/06/22 1055 89     Resp 04/06/22 1055 18     Temp 04/06/22 1055 98 F (36.7 C)     Temp src --      SpO2 04/06/22 1055 100 %     Weight 04/06/22 1108 164 lb 0.4 oz (74.4 kg)     Height 04/06/22 1108 5\' 2"  (1.575 m)     Head Circumference --      Peak Flow --      Pain Score 04/06/22 1055 0     Pain Loc --      Pain Edu? --      Excl. in GC? --     Most recent vital signs: Vitals:   04/06/22 1055  BP: (!) 169/108  Pulse: 89  Resp: 18  Temp: 98 F (36.7 C)  SpO2: 100%    General: Awake, no distress.  CV:  Good peripheral perfusion.  Resp:  Normal effort.  Abd:  No distention.  Other:  Awake alert pleasant and cooperative, oriented, moving all extremities appears euvolemic.   ED Results / Procedures / Treatments   Labs (all labs ordered are listed, but only abnormal results are displayed) Labs Reviewed  CBG MONITORING, ED - Abnormal; Notable for the following components:      Result Value   Glucose-Capillary 115 (*)    All other components within normal limits     I reviewed labs and they are notable for glucose in 100s  EKG  ED  ECG REPORT I, 04/08/22, the attending physician, personally viewed and interpreted this ECG.   Date: 04/06/2022  EKG Time: 1141  Rate: 83  Rhythm: normal sinus rhythm  Axis: nl  Intervals:none  ST&T Change: no ischemic changes  or dysrhtmia      PROCEDURES:  Critical Care performed: No  Procedures   MEDICATIONS ORDERED IN ED: Medications - No data to display   IMPRESSION / MDM / ASSESSMENT AND PLAN / ED COURSE  I reviewed the triage vital signs and the nursing notes.                              Differential diagnosis includes, but is not limited to, heat exhaustion, dysrhythmia, hypoglycemia   MDM: Patient with exposure to heat and felt overheated, transient symptoms and not prolonged exposure.  Doubt heat exhaustion heatstroke or other significant heat related pathologies.  Check EKG which is normal sinus rhythm, rule out dysrhythmias or ACS which was  unlikely to begin with.  Also check blood sugar which was normal.  Patient feels well and would like a work note.  I gave her a work note and she is safe for discharge with PMD follow-up.   Patient's presentation is most consistent with acute presentation with potential threat to life or bodily function.       FINAL CLINICAL IMPRESSION(S) / ED DIAGNOSES   Final diagnoses:  Lightheadedness     Rx / DC Orders   ED Discharge Orders     None        Note:  This document was prepared using Dragon voice recognition software and may include unintentional dictation errors.    Pilar Jarvis, MD 04/06/22 [phone]

## 2022-08-28 ENCOUNTER — Emergency Department
Admission: EM | Admit: 2022-08-28 | Discharge: 2022-08-28 | Disposition: A | Discharge status: Home / Self Care | Payer: 59 | Attending: Emergency Medicine | Admitting: Emergency Medicine

## 2022-08-28 DIAGNOSIS — T733XXA Exhaustion due to excessive exertion, initial encounter: Secondary | ICD-10-CM | POA: Diagnosis not present

## 2022-08-28 DIAGNOSIS — R5383 Other fatigue: Secondary | ICD-10-CM | POA: Diagnosis not present

## 2022-08-28 DIAGNOSIS — I10 Essential (primary) hypertension: Secondary | ICD-10-CM | POA: Insufficient documentation

## 2022-08-28 MED ORDER — HYDROCHLOROTHIAZIDE 12.5 MG PO CAPS: 12.5000 mg | ORAL_CAPSULE | Freq: Every day | ORAL | 2 refills | Status: AC

## 2022-08-28 NOTE — ED Triage Notes (Signed)
Pt sts that she works in a [employer] and also works another full time job and believes that she got overheated and is just tired, however he boss wants her to get a Dr note. Pt is ambulatory in triage, A/Ox4.

## 2022-08-28 NOTE — ED Provider Notes (Signed)
[facility] Emergency Department Provider Note     Event Date/Time   First MD Initiated Contact with Patient 08/28/22 1130     (approximate)   History   Fatigue   HPI  Felicia Watkins is a 32 y.o. female history of HTN, presents to the ED at the advice of her supervisor.  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  She reports symptoms today after she got overheated.  She now is reporting some fatigue but denies any fever, chills, sweats, chest pain, or shortness of breath.  Patient also denies any headache or syncope.  Her employer wishes her to be evaluated and returned with a doctor's note.  Physical Exam   Triage Vital Signs: ED Triage Vitals [08/28/22 1127]  Enc Vitals Group     BP (!) 160/99     Pulse Rate 88     Resp 17     Temp 98.1 F (36.7 C)     Temp Source Oral     SpO2 99 %     Weight 174 lb (78.9 kg)     Height      Head Circumference      Peak Flow      Pain Score 0     Pain Loc      Pain Edu?      Excl. in GC?     Most recent vital signs: Vitals:   08/28/22 1127  BP: (!) 160/99  Pulse: 88  Resp: 17  Temp: 98.1 F (36.7 C)  SpO2: 99%    General Awake, no distress. NAD CV:  Good peripheral perfusion.  RESP:  Normal effort.  ABD:  No distention.   ED Results / Procedures / Treatments   Labs (all labs ordered are listed, but only abnormal results are displayed) Labs Reviewed - No data to display    EKG   RADIOLOGY  No results found.   PROCEDURES:  Critical Care performed: No  Procedures   MEDICATIONS ORDERED IN ED: Medications - No data to display   IMPRESSION / MDM / ASSESSMENT AND PLAN / ED COURSE  I reviewed the triage vital signs and the nursing notes.                              Differential diagnosis includes, but is not limited to, dehydration, fatigue, poor sleep  Patient's presentation is most consistent with acute,  uncomplicated illness.  Patient presents to the ED in no acute distress patient denies any complaints at this time, denying chest pain, shortness of breath, or syncope.  She does feel like she has "burned the candle from both ends" and likely needs some rest.  Patient's diagnosis is consistent with fatigue. Patient will be discharged home with prescriptions for courtesy refill of her high blood pressure medicine. Patient is to follow up with a local community clinic as needed or otherwise directed. Patient is given ED precautions to return to the ED for any worsening or new symptoms.   FINAL CLINICAL IMPRESSION(S) / ED DIAGNOSES   Final diagnoses:  Fatigue due to excessive exertion, initial encounter     Rx / DC Orders   ED Discharge Orders          Ordered    hydrochlorothiazide (MICROZIDE) 12.5 MG capsule  Daily        08/28/22 1149  Note:  This document was prepared using Dragon voice recognition software and may include unintentional dictation errors.    Lissa Hoard, PA-C 08/28/22 1201    Jene Every, MD 08/28/22 1242

## 2022-08-28 NOTE — Discharge Instructions (Addendum)
Take the prescription meds as directed. Take your blood pressure medicine as directed. Drink fluids to reduce dehydration.

## 2022-08-30 ENCOUNTER — Telehealth: Payer: Self-pay

## 2022-08-30 NOTE — Telephone Encounter (Signed)
Received referral from ED. Left vmail.  

## 2022-09-30 ENCOUNTER — Emergency Department
Admission: EM | Admit: 2022-09-30 | Discharge: 2022-09-30 | Disposition: A | Discharge status: Home / Self Care | Payer: 59 | Attending: Emergency Medicine | Admitting: Emergency Medicine

## 2022-09-30 ENCOUNTER — Other Ambulatory Visit: Payer: Self-pay

## 2022-09-30 DIAGNOSIS — H11421 Conjunctival edema, right eye: Secondary | ICD-10-CM | POA: Insufficient documentation

## 2022-09-30 DIAGNOSIS — T7840XA Allergy, unspecified, initial encounter: Secondary | ICD-10-CM | POA: Insufficient documentation

## 2022-09-30 DIAGNOSIS — H5789 Other specified disorders of eye and adnexa: Secondary | ICD-10-CM | POA: Diagnosis not present

## 2022-09-30 MED ORDER — TETRACAINE HCL 0.5 % OP SOLN
2.0000 [drp] | Freq: Once | OPHTHALMIC | Status: DC
  Filled 2022-09-30: qty 4

## 2022-09-30 MED ORDER — OLOPATADINE HCL 0.1 % OP SOLN: 1.0000 [drp] | Freq: Two times a day (BID) | OPHTHALMIC | 12 refills | Status: AC

## 2022-09-30 MED ORDER — DIPHENHYDRAMINE HCL 50 MG/ML IJ SOLN
12.5000 mg | Freq: Once | INTRAMUSCULAR | Status: AC
  Administered 2022-09-30: 12.5 mg via INTRAVENOUS
  Filled 2022-09-30: qty 1

## 2022-09-30 MED ORDER — METOCLOPRAMIDE HCL 5 MG/ML IJ SOLN
10.0000 mg | Freq: Once | INTRAMUSCULAR | Status: AC
  Administered 2022-09-30: 10 mg via INTRAVENOUS
  Filled 2022-09-30: qty 2

## 2022-09-30 MED ORDER — METHYLPREDNISOLONE SODIUM SUCC 125 MG IJ SOLR
125.0000 mg | Freq: Once | INTRAMUSCULAR | Status: AC
  Administered 2022-09-30: 125 mg via INTRAVENOUS
  Filled 2022-09-30: qty 2

## 2022-09-30 MED ORDER — FLUORESCEIN SODIUM 1 MG OP STRP
1.0000 | ORAL_STRIP | Freq: Once | OPHTHALMIC | Status: DC
  Filled 2022-09-30: qty 1

## 2022-09-30 MED ORDER — PREDNISONE 10 MG (21) PO TBPK: ORAL_TABLET | ORAL | 0 refills | Status: AC

## 2022-09-30 NOTE — Discharge Instructions (Signed)
Return to the ER for any sensation of shortness of breath or difficulty swallowing.   Take Benadryl 25mg  every 6 hours if you start itching.

## 2022-09-30 NOTE — ED Triage Notes (Signed)
Pt presents with R eye swelling. Reports they believe a bug hit them in the eye. No redness or drainage noted. No itching or swelling anywhere else. Sclera not red. Pt ambulatory to triage. Alert and oriented following commands. Breathing unlabored speaking in full sentences.

## 2022-09-30 NOTE — ED Provider Notes (Signed)
[facility] Provider Note    Event Date/Time   First MD Initiated Contact with Patient 09/30/22 1913     (approximate)   History   eye swelling    HPI  Felicia Watkins is a 32 y.o. female with no significant past medical history and as listed in EMR presents to the emergency department for treatment and evaluation of right eye swelling. 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 She flushed it with water and foreign body sensation resolved, but now her upper and lower lid on the right is swollen. No vision change. Swelling happened quickly. She is allergic to bees, but is unsure what hit her today. She denies shortness of breath.      Physical Exam   Triage Vital Signs: ED Triage Vitals  Enc Vitals Group     BP 09/30/22 1908 (!) 165/110     Pulse Rate 09/30/22 1908 90     Resp 09/30/22 1908 18     Temp 09/30/22 1908 98.5 F (36.9 C)     Temp Source 09/30/22 1908 Oral     SpO2 09/30/22 1908 98 %     Weight 09/30/22 1907 185 lb (83.9 kg)     Height 09/30/22 1907 5\' 4"  (1.626 m)     Head Circumference --      Peak Flow --      Pain Score 09/30/22 1907 3     Pain Loc --      Pain Edu? --      Excl. in GC? --     Most recent vital signs: Vitals:   09/30/22 1908  BP: (!) 165/110  Pulse: 90  Resp: 18  Temp: 98.5 F (36.9 C)  SpO2: 98%    General: Awake, no distress.  CV:  Good peripheral perfusion.  Resp:  Normal effort.  Abd:  No distention.  Other:  Right upper and lower lid swollen. Sclera of the right eye normal.   ED Results / Procedures / Treatments   Labs (all labs ordered are listed, but only abnormal results are displayed) Labs Reviewed - No data to display   EKG  Not indicated   RADIOLOGY  Image and radiology report reviewed and interpreted by me. Radiology report consistent with the same.  Not indicated  PROCEDURES:  Critical Care performed: No  Procedures   MEDICATIONS ORDERED IN ED:  Medications   diphenhydrAMINE (BENADRYL) injection 12.5 mg (12.5 mg Intravenous Given 09/30/22 2034)  methylPREDNISolone sodium succinate (SOLU-MEDROL) 125 mg/2 mL injection 125 mg (125 mg Intravenous Given 09/30/22 2032)  metoCLOPramide (REGLAN) injection 10 mg (10 mg Intravenous Given 09/30/22 2035)     IMPRESSION / MDM / ASSESSMENT AND PLAN / ED COURSE   I have reviewed the triage note.  Differential diagnosis includes, but is not limited to, chemosis, allergic reaction, preseptal cellulitis  Patient's presentation is most consistent with acute illness / injury with system symptoms.  32 year old female presents to the emergency department for treatment and evaluation of acute onset right eye swelling.  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  On exam, she has right-sided upper and lower eyelid swelling.  Conjunctiva is injected.  No retained foreign bodies noted on magnified exam.  Due to the sudden onset of the swelling.  Will be presumed that she is having an allergic reaction.  She will be given Solu-Medrol, Benadryl, and Reglan.  Patient aware and agreeable to the plan.  Symptoms  improving.  The upper lid has responded well to medications.  The lower lid is still swollen but patient states that she feels better overall.  She will be discharged home with ER return precautions.  Work excuse provided for tomorrow as well.      FINAL CLINICAL IMPRESSION(S) / ED DIAGNOSES   Final diagnoses:  Allergic reaction, initial encounter  Chemosis of right conjunctiva     Rx / DC Orders   ED Discharge Orders          Ordered    predniSONE (STERAPRED UNI-PAK 21 TAB) 10 MG (21) TBPK tablet        09/30/22 2126    olopatadine (PATADAY) 0.1 % ophthalmic solution  2 times daily        09/30/22 2133             Note:  This document was prepared using Dragon voice recognition software and may include unintentional dictation errors.   Chinita Pester,  FNP 09/30/22 2133    Georga Hacking, MD 10/01/22 [phone]

## 2024-03-01 ENCOUNTER — Emergency Department
Admission: EM | Admit: 2024-03-01 | Discharge: 2024-03-01 | Disposition: A | Discharge status: Home / Self Care | Payer: Self-pay | Attending: Emergency Medicine | Admitting: Emergency Medicine

## 2024-03-01 ENCOUNTER — Other Ambulatory Visit: Payer: Self-pay

## 2024-03-01 DIAGNOSIS — R112 Nausea with vomiting, unspecified: Secondary | ICD-10-CM | POA: Insufficient documentation

## 2024-03-01 NOTE — ED Triage Notes (Signed)
 Pt to ED from work for doctor's note. She was working in a [employer] and the [facility] [employer] was not working and got hot and vomited one time. She felt fine after that and didn't want to come but work made her come. Denies nausea, vomiting and pain at this time. States she just needs the note.

## 2024-03-01 NOTE — ED Provider Notes (Signed)
 [facility] Emergency Department Provider Note     Event Date/Time   First MD Initiated Contact with Patient 03/01/24 1509     (approximate)   History   Emesis (1X)   HPI  Felicia Watkins is a 33 y.o. female presents to the ED following an episode of vomiting at work earlier today. Patient reports she was working in a [employer] with no AC and vomited up her breakfast. She is requesting a work note to return to work. Symptoms have completley resolved on my evaluation. Denies nausea, fever or recent illnesses. No abdominal pain, chest pain, hematemesis, or shortness of breath.      Physical Exam   Triage Vital Signs: ED Triage Vitals  Encounter Vitals Group     BP 03/01/24 1504 (!) 159/100     Girls Systolic BP Percentile --      Girls Diastolic BP Percentile --      Boys Systolic BP Percentile --      Boys Diastolic BP Percentile --      Pulse Rate 03/01/24 1504 98     Resp 03/01/24 1504 16     Temp 03/01/24 1504 99.1 F (37.3 C)     Temp Source 03/01/24 1504 Oral     SpO2 03/01/24 1504 100 %     Weight 03/01/24 1502 161 lb (73 kg)     Height 03/01/24 1502 5' 4 (1.626 m)     Head Circumference --      Peak Flow --      Pain Score 03/01/24 1501 0     Pain Loc --      Pain Education --      Exclude from Growth Chart --     Most recent vital signs: Vitals:   03/01/24 1504  BP: (!) 159/100  Pulse: 98  Resp: 16  Temp: 99.1 F (37.3 C)  SpO2: 100%   General Awake, no distress.  HEENT NCAT.  CV:  Good peripheral perfusion.  RESP:  Normal effort.  ABD:  No distention. Soft, non tender  ED Results / Procedures / Treatments   Labs (all labs ordered are listed, but only abnormal results are displayed) Labs Reviewed - No data to display  No results found.  PROCEDURES:  Critical Care performed: No  Procedures  MEDICATIONS ORDERED IN ED: Medications - No data to display  IMPRESSION / MDM / ASSESSMENT AND PLAN / ED COURSE   I reviewed the triage vital signs and the nursing notes.                               33 y.o. female presents to the emergency department for evaluation and treatment of 1 episode of vomiting. See HPI for further details.   Differential diagnosis includes, but is not limited to gastroenteritis, GERD,  heat exhaustion  Patient's presentation is most consistent with acute, uncomplicated illness.  Patient is alert and oriented.  She is hemodynamically stable.  Noted elevated BP of 159/100 and which appears to be patient's baseline.  Physical exam findings are benign.  Normal abdomen exam.  Antiemetic medication offered to patient in which she kindly declined.  She is simply here for a work note.  Work note provided at discharge.  She is in stable condition for discharge home. ED precautions discussed.    FINAL CLINICAL IMPRESSION(S) / ED DIAGNOSES   Final diagnoses:  Nausea and vomiting, unspecified  vomiting type   Rx / DC Orders   ED Discharge Orders     None        Note:  This document was prepared using Dragon voice recognition software and may include unintentional dictation errors.    Margrette, Quention Mcneill A, PA-C 03/01/24 1537    Floy Roberts, MD 03/01/24 [phone]

## 2024-03-01 NOTE — Discharge Instructions (Signed)
 Your physical exam findings are reassuring.  Stay hydrated and cool during work hours.  Follow-up with your primary care provider as needed.
